# Patient Record
Sex: Female | Born: 1960 | Race: Black or African American | Hispanic: No | Marital: Married | Smoking: Never smoker
Health system: Southern US, Community
[De-identification: ages and names within clinical notes are randomized; demographics above are authoritative.]

## PROBLEM LIST (undated history)

## (undated) DIAGNOSIS — Z780 Asymptomatic menopausal state: Secondary | ICD-10-CM

## (undated) HISTORY — DX: Asymptomatic menopausal state: Z78.0

---

## 1997-06-10 ENCOUNTER — Emergency Department (HOSPITAL_COMMUNITY): Admission: EM | Admit: 1997-06-10 | Discharge: 1997-06-10 | Payer: Self-pay | Admitting: Emergency Medicine

## 1997-06-10 ENCOUNTER — Ambulatory Visit (HOSPITAL_COMMUNITY): Admission: RE | Admit: 1997-06-10 | Discharge: 1997-06-10 | Payer: Self-pay | Admitting: Family Medicine

## 1998-01-08 HISTORY — PX: ABDOMINAL HYSTERECTOMY: SHX81

## 1998-01-24 ENCOUNTER — Inpatient Hospital Stay (HOSPITAL_COMMUNITY): Admission: RE | Admit: 1998-01-24 | Discharge: 1998-01-26 | Payer: Self-pay | Admitting: Obstetrics and Gynecology

## 2001-03-17 ENCOUNTER — Other Ambulatory Visit: Admission: RE | Admit: 2001-03-17 | Discharge: 2001-03-17 | Payer: Self-pay | Admitting: Obstetrics and Gynecology

## 2002-06-01 ENCOUNTER — Other Ambulatory Visit: Admission: RE | Admit: 2002-06-01 | Discharge: 2002-06-01 | Payer: Self-pay | Admitting: Obstetrics and Gynecology

## 2004-03-23 ENCOUNTER — Ambulatory Visit: Payer: Self-pay | Admitting: Family Medicine

## 2005-04-26 ENCOUNTER — Ambulatory Visit: Payer: Self-pay | Admitting: Family Medicine

## 2011-01-09 LAB — HM PAP SMEAR

## 2011-01-09 LAB — HM MAMMOGRAPHY

## 2011-06-11 ENCOUNTER — Telehealth: Payer: Self-pay | Admitting: Family Medicine

## 2011-06-11 NOTE — Telephone Encounter (Signed)
That is fine 

## 2011-06-11 NOTE — Telephone Encounter (Signed)
This patient was last seen 2007, she has recently passed out & her OB suggested she come back to her Primary Care Provider. [facility] cancelled for tomorrow can I use the 3pm for Tuesday for this patient to re-establish? (FYI I put her in this spot just to hold but advised her I would have to call her Back?

## 2011-06-11 NOTE — Telephone Encounter (Signed)
lmom appt was approved, patient should arrive 15-minutes early

## 2011-06-11 NOTE — Telephone Encounter (Signed)
Please advise      KP 

## 2011-06-12 ENCOUNTER — Ambulatory Visit (INDEPENDENT_AMBULATORY_CARE_PROVIDER_SITE_OTHER): Payer: BC Managed Care – PPO | Admitting: Family Medicine

## 2011-06-12 ENCOUNTER — Encounter: Payer: Self-pay | Admitting: Family Medicine

## 2011-06-12 VITALS — BP 130/74 | HR 59 | Temp 98.3°F | Ht 68.0 in | Wt 179.6 lb

## 2011-06-12 DIAGNOSIS — R55 Syncope and collapse: Secondary | ICD-10-CM

## 2011-06-12 LAB — HEPATIC FUNCTION PANEL
AST: 23 U/L (ref 0–37)
Albumin: 4.2 g/dL (ref 3.5–5.2)
Alkaline Phosphatase: 43 U/L (ref 39–117)
Total Bilirubin: 0.6 mg/dL (ref 0.3–1.2)

## 2011-06-12 LAB — POCT URINALYSIS DIPSTICK
Glucose, UA: NEGATIVE
Leukocytes, UA: NEGATIVE
Nitrite, UA: NEGATIVE
Urobilinogen, UA: 0.2

## 2011-06-12 LAB — CBC WITH DIFFERENTIAL/PLATELET
Basophils Absolute: 0 10*3/uL (ref 0.0–0.1)
Hemoglobin: 13 g/dL (ref 12.0–15.0)
Lymphocytes Relative: 46.1 % — ABNORMAL HIGH (ref 12.0–46.0)
Monocytes Relative: 7.8 % (ref 3.0–12.0)
Neutro Abs: 1.3 10*3/uL — ABNORMAL LOW (ref 1.4–7.7)
RDW: 13.2 % (ref 11.5–14.6)
WBC: 3 10*3/uL — ABNORMAL LOW (ref 4.5–10.5)

## 2011-06-12 LAB — BASIC METABOLIC PANEL
Calcium: 9.1 mg/dL (ref 8.4–10.5)
GFR: 116.39 mL/min (ref >60.00)
Glucose, Bld: 80 mg/dL (ref 70–99)
Sodium: 142 mEq/L (ref 135–145)

## 2011-06-12 LAB — LIPID PANEL
Total CHOL/HDL Ratio: 4
VLDL: 10.2 mg/dL (ref 0.0–40.0)

## 2011-06-12 LAB — CARDIAC PANEL: CK-MB: 1.7 ng/mL (ref 0.3–4.0)

## 2011-06-12 NOTE — Patient Instructions (Signed)
Syncope You have had a fainting (syncopal) spell. A fainting episode is a sudden, short-lived loss of consciousness. It results in complete recovery. It occurs because there has been a temporary shortage of oxygen and/or sugar (glucose) to the brain. CAUSES   Blood pressure pills and other medications that may lower blood pressure below normal. Sudden changes in posture (sudden standing).   Over-medication. Take your medications as directed.   Standing too long. This can cause blood to pool in the legs.   Seizure disorders.   Low blood sugar (hypoglycemia) of diabetes. This more commonly causes coma.   Bearing down to go to the bathroom. This can cause your blood pressure to rise suddenly. Your body compensates by making the blood pressure too low when you stop bearing down.   Hardening of the arteries where the brain temporarily does not receive enough blood.   Irregular heart beat and circulatory problems.   Fear, emotional distress, injury, sight of blood, or illness.  Your caregiver will send you home if the syncope was from non-worrisome causes (benign). Depending on your age and health, you may stay to be monitored and observed. If you return home, have someone stay with you if your caregiver feels that is desirable. It is very important to keep all follow-up referrals and appointments in order to properly manage this condition. This is a serious problem which can lead to serious illness and death if not carefully managed.  WARNING: Do not drive or operate machinery until your caregiver feels that it is safe for you to do so. SEEK IMMEDIATE MEDICAL CARE IF:   You have another fainting episode or faint while lying or sitting down. DO NOT DRIVE YOURSELF. Call 911 if no other help is available.   You have chest pain, are feeling sick to your stomach (nausea), vomiting or abdominal pain.   You have an irregular heartbeat or one that is very fast (pulse over 120 beats per minute).    You have a loss of feeling in some part of your body or lose movement in your arms or legs.   You have difficulty with speech, confusion, severe weakness, or visual problems.   You become sweaty and/or feel light headed.  Make sure you are rechecked as instructed. Document Released: 12/25/2004 Document Revised: 12/14/2010 Document Reviewed: 08/15/2006 [employer] Patient Information 2012 [employer]. 

## 2011-06-12 NOTE — Progress Notes (Signed)
  Subjective:    Cheyenne Wheeler is a 51 y.o. female who presents for evaluation of syncope. Onset was 4 days ago. Symptoms have  completely resolved since that time. Patient describes the episode as syncopal episode occurred during normal non-stressful ADLs. Associated symptoms: nausea and hot flash. The patient denies abdominal pain, diarrhea, excessive thirst, general feeling of lightheadedness, headache, heavy menstrual bleeding, history of CAD, melena, tachycardia/palpitations and visual aura. Medications putting patient at risk for syncope: estra test.  The following portions of the patient's history were reviewed and updated as appropriate: allergies, current medications, past family history, past medical history, past social history, past surgical history and problem list.  Review of Systems Pertinent items are noted in HPI.   Objective:    BP 130/74  Pulse 59  Temp(Src) 98.3 F (36.8 C) (Oral)  Ht 5\' 8"  (1.727 m)  Wt 179 lb 9.6 oz (81.466 kg)  BMI 27.31 kg/m2  SpO2 97% General appearance: alert, cooperative, appears stated age and no distress Throat: lips, mucosa, and tongue normal; teeth and gums normal Neck: no adenopathy, no carotid bruit, no JVD, supple, symmetrical, trachea midline and thyroid not enlarged, symmetric, no tenderness/mass/nodules Lungs: clear to auscultation bilaterally Heart: regular rate and rhythm, S1, S2 normal, no murmur, click, rub or gallop Extremities: extremities normal, atraumatic, no cyanosis or edema  Cardiographics ECG: sinus bradycardia   Assessment:    Loss of consciousness of uncertain cause   Plan:    ECG. Lab per orders. Follow up in 1 month, sooner should symptoms worsen.  She will discuss hormones with gyn Consider further w/u if labs normal

## 2011-06-13 ENCOUNTER — Telehealth: Payer: Self-pay | Admitting: *Deleted

## 2011-06-13 LAB — LDL CHOLESTEROL, DIRECT: Direct LDL: 164.4 mg/dL

## 2011-06-13 NOTE — Telephone Encounter (Signed)
Office Message [address] [place], [place] [address] p. [phone] f. [phone] To: [place] [facility] Fax: [phone] From: [facility] Date/ Time: 06/12/2011 7:11 PM Taken By: Catheryn Bacon, CSR Caller: Zella Ball Facility: [facility] Patient: Enes, Wegener DOB: 1960/05/27 Phone: [phone] Reason for Call: Zella Ball is calling from [employer] regarding a Troponin I ordered on [place], [place] by Loreen Freud. The results were Less than 0.01, no indication of myocardial injury. Regarding Appointment: Appt Date: Appt Time: Unknown Provider: Reason: Details: Outcome:

## 2013-06-25 ENCOUNTER — Encounter (HOSPITAL_COMMUNITY): Payer: Self-pay | Admitting: Emergency Medicine

## 2013-06-25 ENCOUNTER — Emergency Department (HOSPITAL_COMMUNITY): Payer: Managed Care, Other (non HMO)

## 2013-06-25 ENCOUNTER — Emergency Department (HOSPITAL_COMMUNITY)
Admission: EM | Admit: 2013-06-25 | Discharge: 2013-06-25 | Disposition: A | Discharge status: Home / Self Care | Payer: Managed Care, Other (non HMO) | Attending: Emergency Medicine | Admitting: Emergency Medicine

## 2013-06-25 DIAGNOSIS — S43109A Unspecified dislocation of unspecified acromioclavicular joint, initial encounter: Secondary | ICD-10-CM | POA: Insufficient documentation

## 2013-06-25 DIAGNOSIS — Y92410 Unspecified street and highway as the place of occurrence of the external cause: Secondary | ICD-10-CM | POA: Insufficient documentation

## 2013-06-25 DIAGNOSIS — Z79899 Other long term (current) drug therapy: Secondary | ICD-10-CM | POA: Insufficient documentation

## 2013-06-25 DIAGNOSIS — Y9389 Activity, other specified: Secondary | ICD-10-CM | POA: Insufficient documentation

## 2013-06-25 DIAGNOSIS — S20219A Contusion of unspecified front wall of thorax, initial encounter: Secondary | ICD-10-CM | POA: Insufficient documentation

## 2013-06-25 DIAGNOSIS — S43102A Unspecified dislocation of left acromioclavicular joint, initial encounter: Secondary | ICD-10-CM

## 2013-06-25 DIAGNOSIS — Z7982 Long term (current) use of aspirin: Secondary | ICD-10-CM | POA: Insufficient documentation

## 2013-06-25 MED ORDER — OXYCODONE-ACETAMINOPHEN 5-325 MG PO TABS: 1.0000 | ORAL_TABLET | ORAL | Status: AC | PRN

## 2013-06-25 NOTE — ED Provider Notes (Signed)
Medical screening examination/treatment/procedure(s) were performed by non-physician practitioner and as supervising physician I was immediately available for consultation/collaboration.   EKG Interpretation None        Lyanne CoKevin M Audwin Semper, MD 06/25/13 [phone]
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
## 2013-06-25 NOTE — Discharge Instructions (Signed)
Take the prescribed medication as directed. Follow-up with Dr. Roda ShuttersXu from orthopedics-- call and schedule appt. Return to the ED for new or worsening symptoms.
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
## 2018-08-13 ENCOUNTER — Emergency Department (HOSPITAL_COMMUNITY)
Admission: EM | Admit: 2018-08-13 | Discharge: 2018-08-13 | Discharge status: Against Medical Advice | Payer: 59 | Attending: Emergency Medicine | Admitting: Emergency Medicine

## 2018-08-13 ENCOUNTER — Other Ambulatory Visit: Payer: Self-pay

## 2018-08-13 DIAGNOSIS — R509 Fever, unspecified: Secondary | ICD-10-CM | POA: Diagnosis present

## 2018-08-13 DIAGNOSIS — Z5321 Procedure and treatment not carried out due to patient leaving prior to being seen by health care provider: Secondary | ICD-10-CM | POA: Insufficient documentation

## 2018-08-13 NOTE — ED Notes (Signed)
Registration reported patient was leaving. Unknown reason.

## 2018-08-14 ENCOUNTER — Encounter (HOSPITAL_COMMUNITY): Payer: Self-pay | Admitting: Emergency Medicine

## 2018-08-14 ENCOUNTER — Emergency Department (HOSPITAL_COMMUNITY)
Admission: EM | Admit: 2018-08-14 | Discharge: 2018-08-14 | Disposition: A | Discharge status: Home / Self Care | Payer: 59 | Attending: Emergency Medicine | Admitting: Emergency Medicine

## 2018-08-14 ENCOUNTER — Other Ambulatory Visit: Payer: Self-pay

## 2018-08-14 DIAGNOSIS — R509 Fever, unspecified: Secondary | ICD-10-CM | POA: Diagnosis not present

## 2018-08-14 DIAGNOSIS — Z5321 Procedure and treatment not carried out due to patient leaving prior to being seen by health care provider: Secondary | ICD-10-CM | POA: Diagnosis not present

## 2018-08-14 NOTE — ED Triage Notes (Signed)
Patient reports presumed fever and sweats x4 days. Has not taken temp at home. Reports negative covid test yesterday. States took tylenol this morning. Afebrile in triage. Denies chest pain, SOB, cough. States wants to be evaluated for sweats.

## 2018-08-14 NOTE — ED Notes (Signed)
No answer in lobby when called.
